# Patient Record
Sex: Female | Born: 2005 | Race: White | Hispanic: No | Marital: Single | Smoking: Never smoker
Health system: Southern US, Community
[De-identification: ages and names within clinical notes are randomized; demographics above are authoritative.]

## PROBLEM LIST (undated history)

## (undated) DIAGNOSIS — F410 Panic disorder [episodic paroxysmal anxiety] without agoraphobia: Secondary | ICD-10-CM

---
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
## 2023-04-14 ENCOUNTER — Other Ambulatory Visit: Payer: Self-pay

## 2023-04-14 ENCOUNTER — Encounter (HOSPITAL_COMMUNITY): Payer: Self-pay | Admitting: Emergency Medicine

## 2023-04-14 ENCOUNTER — Emergency Department (HOSPITAL_COMMUNITY)
Admission: EM | Admit: 2023-04-14 | Discharge: 2023-04-14 | Disposition: A | Discharge status: Home / Self Care | Payer: Commercial Managed Care - PPO | Attending: Emergency Medicine | Admitting: Emergency Medicine

## 2023-04-14 DIAGNOSIS — K122 Cellulitis and abscess of mouth: Secondary | ICD-10-CM | POA: Diagnosis not present

## 2023-04-14 DIAGNOSIS — R220 Localized swelling, mass and lump, head: Secondary | ICD-10-CM | POA: Diagnosis present

## 2023-04-14 MED ORDER — LEVOFLOXACIN 500 MG PO TABS
500.0000 mg | ORAL_TABLET | Freq: Once | ORAL | Status: AC
  Administered 2023-04-14: 500 mg via ORAL
  Filled 2023-04-14: qty 1

## 2023-04-14 MED ORDER — LEVOFLOXACIN 500 MG PO TABS: 500.0000 mg | ORAL_TABLET | Freq: Every day | ORAL | 0 refills | Status: AC

## 2023-04-14 NOTE — ED Provider Notes (Signed)
[place] EMERGENCY DEPARTMENT AT [facility] Provider Note   CSN: [id number] Arrival date & time: 04/14/23  1737     History  Chief Complaint  Patient presents with   Wound Infection    Janet Decker is a 17 y.o. female.  With a history of panic disorder who presents to the ED for evaluation of a lip infection.  She states she took a lip piercing out in approximately 4 days ago.  She noticed swelling the day afterwards.  Swelling has stayed the same since that time.  No purulent drainage.  She states she has swollen lymph nodes on the left side of her neck as well.  She did have a panic attack earlier today where she had some difficulty breathing.  This is the reason for presentation today.  She reports objective fevers.  Currently denies any difficulty breathing.  She has never had any difficulty swallowing.  She has some nausea yesterday.  No episodes of emesis.  No voice changes.  HPI     Home Medications Prior to Admission medications   Medication Sig Start Date End Date Taking? Authorizing Provider  levofloxacin (LEVAQUIN) 500 MG tablet Take 1 tablet (500 mg total) by mouth daily. 04/14/23  Yes Briceyda Abdullah, Edsel Petrin, PA-C  meclizine (ANTIVERT) 25 MG tablet Take 1 tablet (25 mg total) by mouth 3 (three) times daily as needed for dizziness. 12/16/21   Carroll Sage, PA-C  nitrofurantoin, macrocrystal-monohydrate, (MACROBID) 100 MG capsule Take 1 capsule (100 mg total) by mouth 2 (two) times daily. 04/22/22   Cecil Cobbs, PA-C  ondansetron (ZOFRAN) 4 MG tablet Take 1 tablet (4 mg total) by mouth every 6 (six) hours. 12/16/21   Carroll Sage, PA-C  ondansetron (ZOFRAN-ODT) 4 MG disintegrating tablet Take 1 tablet (4 mg total) by mouth every 8 (eight) hours as needed for nausea or vomiting. 04/22/22   Cecil Cobbs, PA-C      Allergies    Cedar and Other    Review of Systems   Review of Systems  Skin:  Positive for wound.  All other systems reviewed  and are negative.   Physical Exam Updated Vital Signs BP 113/78 (BP Location: Left Arm)   Pulse (!) 101   Temp 98.8 F (37.1 C) (Oral)   Resp 16   Ht 5\' 2"  (1.575 m)   Wt 72.6 kg   LMP 03/31/2023 (Approximate)   SpO2 100%   BMI 29.26 kg/m  Physical Exam Vitals and nursing note reviewed.  Constitutional:      General: She is not in acute distress.    Appearance: Normal appearance. She is normal weight. She is not ill-appearing.     Comments: Resting comfortably in bed  HENT:     Head: Normocephalic and atraumatic.     Mouth/Throat:     Comments: Puncture wound to the left lower lip consistent with previous piercing.  Minimal surrounding erythema.  Mild swelling to this area.  No induration.  No fluctuance.  No purulent drainage noted.  Small amount of left anterior lymphadenopathy.  Tongue is resting comfortably on the floor of the mouth.  Soft palate rises with phonation.  Airway is widely patent.  No intraoral swelling.  No neck tenderness to palpation.  No neck rigidity.  No drooling, trismus or tripoding Pulmonary:     Effort: Pulmonary effort is normal. No respiratory distress.  Abdominal:     General: Abdomen is flat.  Musculoskeletal:  General: Normal range of motion.     Cervical back: Neck supple.  Skin:    General: Skin is warm and dry.  Neurological:     Mental Status: She is alert and oriented to person, place, and time.  Psychiatric:        Mood and Affect: Mood normal.        Behavior: Behavior normal.     ED Results / Procedures / Treatments   Labs (all labs ordered are listed, but only abnormal results are displayed) Labs Reviewed - No data to display  EKG None  Radiology No results found.  Procedures Procedures    Medications Ordered in ED Medications  levofloxacin (LEVAQUIN) tablet 500 mg (500 mg Oral Given 04/14/23 1936)    ED Course/ Medical Decision Making/ A&P                             Medical Decision Making This patient  presents to the ED for concern of piercing infection, this involves an extensive number of treatment options, and is a complaint that carries with it a high risk of complications and morbidity.  The differential diagnosis includes cellulitis, Ludwig's angina, soft tissue abscess  Additional history obtained from: Nursing notes from this visit. Family mother is at bedside and provides a portion of the history  Consultations Obtained:  I requested consultation with pharmacist,  and discussed pertinent plan - they recommend: Levofloxacin  Borderline febrile, initially tachycardic which improved during her stay to less than 90 bpm in the ED.  17 year old female presents to the ED for evaluation of an infection to a piercing site in her lip.  Swelling has been present for 3 days.  She reports some subjective fevers.  No difficulty breathing or swallowing.  Physical exam reveals some small swelling surrounding the area of the piercing.  She appears otherwise very well.  I had a shared decision-making conversation with the patient regarding labs considering her low-grade fever and tachycardia.  She states that she always gets tachycardia when she is in a hospital setting.  She adamantly declines labs and IV fluids in the ED. Patient will be started on levofloxacin daily for 7 days.  She states she should be able to follow-up with her primary care provider on Tuesday.  This was strongly encouraged.  She was given strict return precautions including signs and symptoms of deep space infection including Ludwig's angina.  Stable at discharge.  At this time there does not appear to be any evidence of an acute emergency medical condition and the patient appears stable for discharge with appropriate outpatient follow up. Diagnosis was discussed with patient who verbalizes understanding of care plan and is agreeable to discharge. I have discussed return precautions with patient and mother who verbalizes understanding.  Patient encouraged to follow-up with their PCP within 5 days. All questions answered.  Note: Portions of this report may have been transcribed using voice recognition software. Every effort was made to ensure accuracy; however, inadvertent computerized transcription errors may still be present.        Final Clinical Impression(s) / ED Diagnoses Final diagnoses:  Cellulitis of mouth    Rx / DC Orders ED Discharge Orders          Ordered    levofloxacin (LEVAQUIN) 500 MG tablet  Daily        04/14/23 1930  Michelle Piper, Cordelia Poche 04/14/23 1950    Vanetta Mulders, MD 04/14/23 [phone]

## 2023-04-14 NOTE — ED Triage Notes (Signed)
Pt c/o left lower lip infection after removing a piercing from the site on Wednesday. She states she feels like the tip of her tongue hurts, her gums are sore, and she has a swollen lymph node in her neck below the site.

## 2023-04-14 NOTE — Discharge Instructions (Addendum)
You have been seen today for your complaint of lip infection. Your discharge medications include levofloxacin.  This is an antibiotic. You should take it as prescribed. You should take it for the entire duration of the prescription. This may cause an upset stomach. This is normal. You may take this with food. You may also eat yogurt to prevent diarrhea. Follow up with: Your primary care provider on Tuesday Please seek immediate medical care if you develop any of the following symptoms: You notice red streaks coming from the infected area. You notice the skin turns purple or black and falls off. At this time there does not appear to be the presence of an emergent medical condition, however there is always the potential for conditions to change. Please read and follow the below instructions.  Do not take your medicine if  develop an itchy rash, swelling in your mouth or lips, or difficulty breathing; call 911 and seek immediate emergency medical attention if this occurs.  You may review your lab tests and imaging results in their entirety on your MyChart account.  Please discuss all results of fully with your primary care provider and other specialist at your follow-up visit.  Note: Portions of this text may have been transcribed using voice recognition software. Every effort was made to ensure accuracy; however, inadvertent computerized transcription errors may still be present.
